# Patient Record
Sex: Female | Born: 1950 | Race: White | Hispanic: No | Marital: Married
Health system: Midwestern US, Academic
[De-identification: ages and names within clinical notes are randomized; demographics above are authoritative.]

---

## 2021-03-31 ENCOUNTER — Encounter: Admit: 2021-03-31 | Discharge: 2021-03-31 | Payer: MEDICARE

## 2021-03-31 ENCOUNTER — Ambulatory Visit: Admit: 2021-03-31 | Discharge: 2021-03-31 | Payer: MEDICARE

## 2021-03-31 DIAGNOSIS — I10 Essential (primary) hypertension: Secondary | ICD-10-CM

## 2021-03-31 DIAGNOSIS — K146 Glossodynia: Secondary | ICD-10-CM

## 2021-03-31 DIAGNOSIS — E7800 Pure hypercholesterolemia, unspecified: Secondary | ICD-10-CM

## 2021-03-31 DIAGNOSIS — E119 Type 2 diabetes mellitus without complications: Secondary | ICD-10-CM

## 2021-03-31 NOTE — Progress Notes
Date of Service: 03/31/2021    Subjective:             Nancy Small is a 70 y.o. female.    History of Present Illness  New patient referred by Dr. Yetta Barre. She presents with her husband for evaluation of mouth sores. States that a few years ago she shoved a qtip in her left ear and has noticed mouth sores throughout her mouth for 2 years. They come and go and usually goes away with steroids or antibiotics. These sores are painful and her mouth and tongue gets red and shes has burning snesation throughout her mouth.  She cannot identify a particular trigger. The sores do not bleed. She does have strong acid reflux and takes a PPI. A punch biopsy was performed by Dr. Jon Gills and was told it was not infectious and no cancer. She has some immunodeficency. She has had cervical spine surgeries but no other head/neck surgeris.  She has a 40 pack years smoking history but quit 12 years ago. No chewing tobacco. She feels like food gets stuck at times but denies aspiration.  She denies dyspnea, voice changes, otalgia, globus sensation, unintentional weight loss, oral fullness, fevers, chills, night sweats, neck lumps/bumps, or throat pain. She uses magic mouth wash which does relive the symptoms and sore but they reappear. + dry mouth.  No dry eyes or known autoimmune disease.    When asked about poor sleep, life stress and anxiety, she and her husband report that she has all of the above, severe.    Medical History:   Diagnosis Date   ? DM (diabetes mellitus) (HCC)    ? High cholesterol    ? Hypertension      Surgical History:   Procedure Laterality Date   ? APPENDECTOMY     ? BACK SURGERY      lower   ? GALLBLADDER SURGERY     ? HYSTERECTOMY     ? NECK SURGERY     ? SHOULDER REPLACEMENT       History reviewed. No pertinent family history.  Social History     Tobacco Use   Smoking Status Former Smoker   Smokeless Tobacco Never Used     Social History     Substance and Sexual Activity   Drug Use Not on file PMH, SH, FH, allergies and medications above have been reviewed.         Review of Systems   Constitutional: Negative.    HENT: Positive for mouth sores.    Eyes: Negative.    Respiratory: Negative.    Cardiovascular: Negative.    Gastrointestinal: Negative.    Endocrine: Negative.    Genitourinary: Negative.    Musculoskeletal: Negative.    Skin: Negative.    Allergic/Immunologic: Negative.    Neurological: Positive for headaches.   Hematological: Negative.    Psychiatric/Behavioral: Negative.          Objective:         ? albuterol-ipratropium (DUONEB) 0.5 mg-3 mg(2.5 mg base)/3 mL nebulizer solution ipratropium 0.5 mg-albuterol 3 mg (2.5 mg base)/3 mL nebulization soln   ? allopurinoL (ZYLOPRIM) 300 mg tablet allopurinol 300 mg tablet   ? bumetanide (BUMEX) 2 mg tablet    ? carisoprodoL (SOMA) 350 mg tablet Take 350 mg by mouth three times daily.   ? diclofenac sodium (VOLTAREN) 1 % topical gel    ? diphenhydrAMINE/lidocaine/antacid (#) (MAGIC MOUTHWASH) 1:1:1 oral suspension Swish and Swallow  by mouth as directed  three times daily as needed for Mouth Pain.   ? duloxetine DR (CYMBALTA) 60 mg capsule duloxetine 60 mg capsule,delayed release   ? estradioL (ESTRACE) 0.5 mg tablet    ? famciclovir (FAMVIR) 250 mg tablet TAKE SIX TABLETS BY MOUTH DAILY FOR ONE DAY   ? famotidine (PEPCID) 20 mg tablet Take 20 mg by mouth twice daily.   ? FEROSUL 325 mg (65 mg iron) tablet Take 325 mg by mouth three times daily.   ? gabapentin (NEURONTIN) 300 mg capsule gabapentin 300 mg capsule   ? hydrOXYchloroQUINE (PLAQUENIL) 200 mg tablet hydroxychloroquine 200 mg tablet   ? hydrOXYzine HCL (ATARAX) 25 mg tablet Take 25 mg by mouth.   ? levothyroxine (SYNTHROID) 75 mcg tablet    ? lidocaine hcl viscous 2 % solution TAKE ONE ML BY MOUTH EVERY THREE HOURS AS NEEDED FOR MOUTH AND THROAT FOR SEVEN DAYS   ? lisinopriL (ZESTRIL) 40 mg tablet 40 mg.   ? Omega-3 Acid Ethyl Esters (LOVAZA) 1 gram capsule    ? omeprazole DR (PRILOSEC) 40 mg capsule    ? oxyCODONE (ROXICODONE) 30 mg tablet TAKE 1 AND 1/2 TABLETS AS NEEDED BY MOUTH EVERY 6 HOURS FOR 28 DAYS   ? pilocarpine HCL (SALAGEN) 5 mg tablet Take 5 mg by mouth three times daily.   ? promethazine (PHENERGAN) 25 mg tablet Take 25 mg by mouth every 4 hours as needed.   ? rizatriptan (MAXALT-MLT) 10 mg rapid dissolve tablet rizatriptan 10 mg disintegrating tablet   ? rOPINIRole (REQUIP) 0.25 mg tablet ropinirole 0.25 mg tablet   ? rosuvastatin (CRESTOR) 10 mg tablet    ? spironolactone (ALDACTONE) 25 mg tablet    ? tamsulosin (FLOMAX) 0.4 mg capsule tamsulosin 0.4 mg capsule   ? tiotropium-olodateroL (STIOLTO RESPIMAT) 2.5-2.5 mcg/actuation inhaler Stiolto Respimat 2.5 mcg-2.5 mcg/actuation solution for inhalation     Vitals:    03/31/21 1034   BP: 132/74   Pulse: 76   PainSc: Three   Weight: 95.3 kg (210 lb)   Height: 160 cm (5' 3)     Body mass index is 37.2 kg/m?Marland Kitchen     Physical Exam  General: Well-developed, well-nourished   Communication and Voice: Clear pitch, normal tone   Hearing: Hearing adequate for verbal communication bilaterally   Inspection: Normocephalic and atraumatic without mass or lesion   Palpation: Facial skeleton intact without bony stepoffs   Facial Strength: Facial motility symmetric and full bilaterally   Pinna: External ear intact and fully developed   External Canal: Canal is patent with intact skin   Tympanic Membrane: TM clear AU   External Nose: No scar or anatomic deformity   Internal Nose: Septum intact. No edema, polyp, or rhinorrhea.   Oral cavity, Lips, Teeth, and Gums: Mild mucosal irritation on the right lateral tongue and right posterior floor of mouth, tender to palpation,  but no discreete masses or ulcers.   Oropharynx: No erythema or exudate, no masses or ulcerations, non-obstructive tonsils  Larynx: Normal voice, no stridor or stertor.   Neck, Trachea, Lymphatics: Midline trachea without mass or lesion, no lymphadenopathy   Thyroid: No mass or nodularity Eyes: No nystagmus with equal extraocular motion bilaterally   Neuro/Psych/Balance: Patient oriented and appropriate in interaction; Appropriate mood and affect; Cranial nerves II-XII are intact   Respiratory effort: Equal inspiration and expiration, no respiratory distress   Peripheral Vascular: Warm extremities    Differential Diagnosis:  Tumor, aphthous stomatitis, lichen planus, pemphigus, neurologic, burning mouth/anxiety, dental, vitamin  deficiency, and diet related       Assessment and Plan:  1. 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     The only class of medications (other than moisturizing agents like pilocarpine or evoxac) that has shown benefit in BMS is benzodiazepine therapy and/or PAXIL (for depression and anxiety).  The patient will work on stress reduction and with her PCP/psychiatrist to find the right balance and follow up on an as needed basis.      Avoid excessive abx and look for thrush/consider diflucan for new or refractory symptoms.    F/u prn if ulcer or sore persists greater than 7-10 days for biopsy.    Pilocarpine (or Evoxac) and Biotine line of oral products recommended.    Maheer Minus Breeding, MD  Otolaryngology Resident    ENT STAFF:    I personally performed or supervised the key portions of the E/M visit, discussed case with the resident and concur with documentation of history, physical exam, assessment, and treatment plan unless otherwise noted.  I personally performed or directly visualized any procedure performed.  Changes or additions are noted in bold.    Johnny Bridge, MD

## 2021-12-20 IMAGING — MR MRI ANKLE RT WO CONTRAST
6 of 7 series · 37 of 40 positions shown · non-contrast
Comparison: None available.

INDICATION: Sprain of ligament of right ankle, initial encounter
TECHNIQUE: Multiplanar, multisequence imaging of the right ankle/hindfoot was performed without  contrast.

[Series 4: t1_sag · sagittal · right · 3.0mm · 0.42mm/px · 5 of 24 slices shown]
[im 1/24]
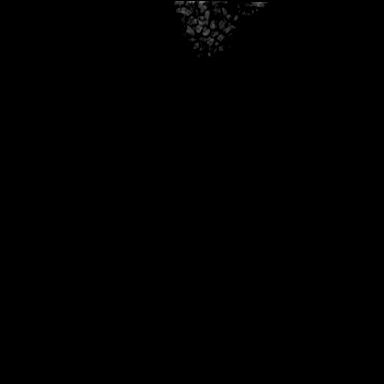
[im 6/24]
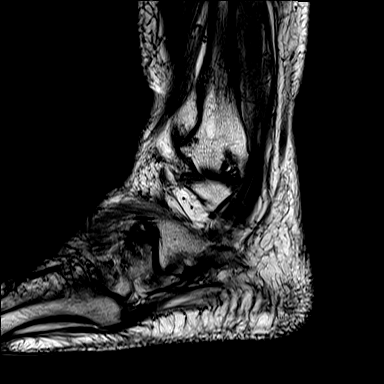
[im 12/24]
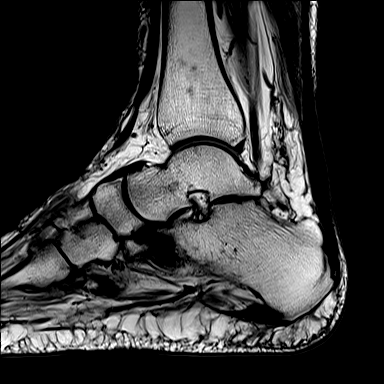
[im 18/24]
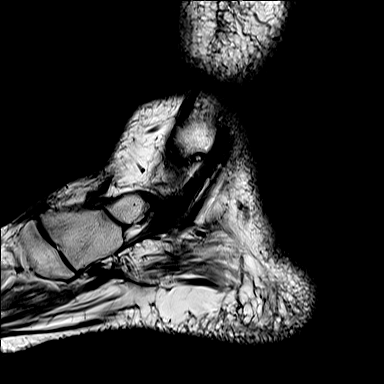
[im 24/24]
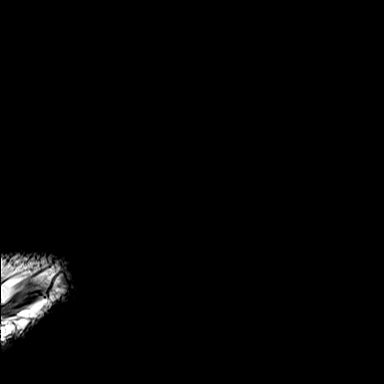

[Series 5: t1_axial · axial · right · 3.0mm · 0.42mm/px · z∈[-86,+21]mm · 6 of 28 slices shown]
[im 1/28]
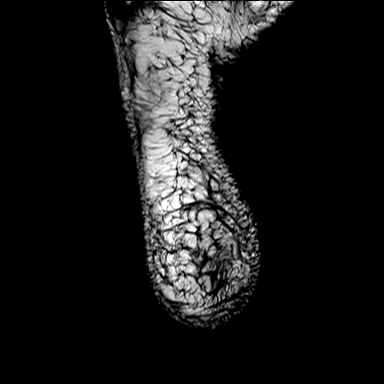
[im 6/28]
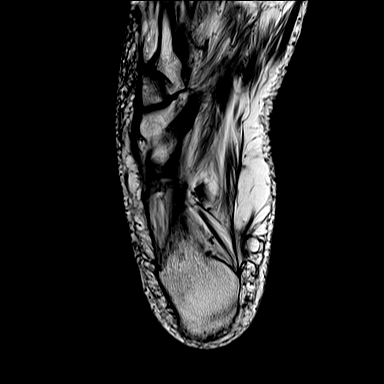
[im 11/28]
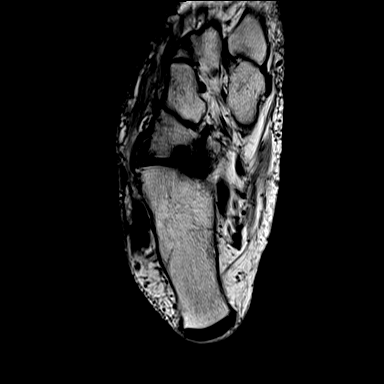
[im 17/28]
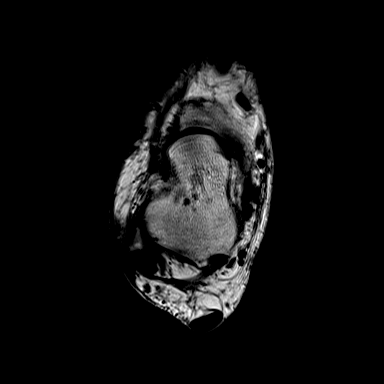
[im 22/28]
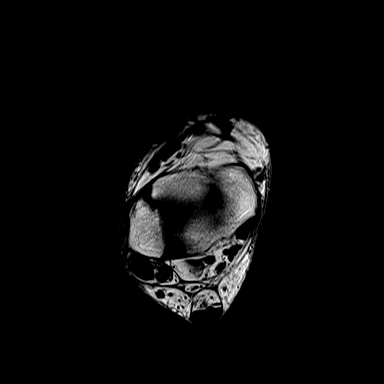
[im 28/28]
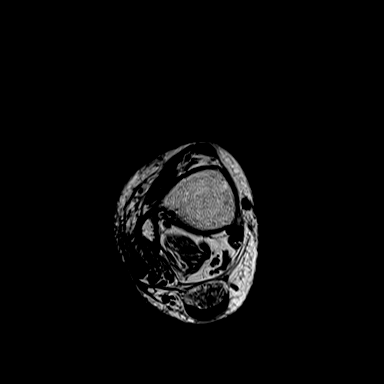

[Series 7: t2_cor_fs · coronal · right · 3.5mm · 0.50mm/px · 7 of 32 slices shown]
[im 1/32]
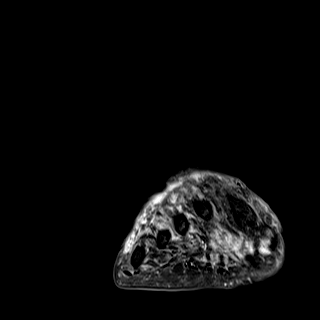
[im 6/32]
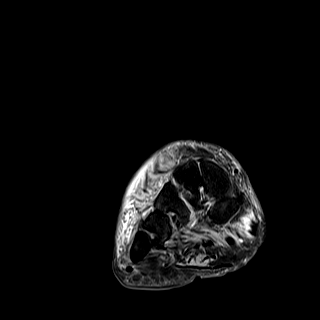
[im 11/32]
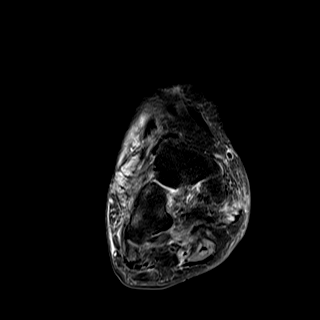
[im 16/32]
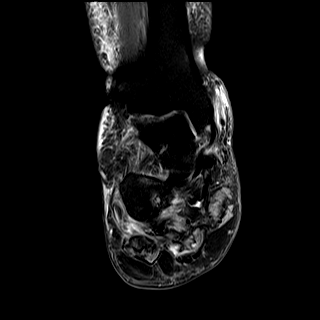
[im 21/32]
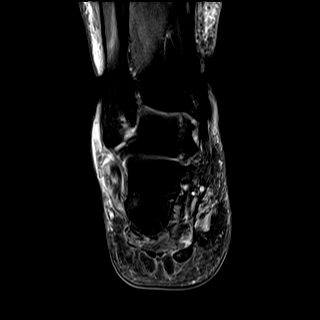
[im 26/32]
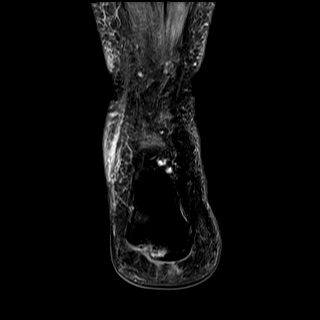
[im 32/32]
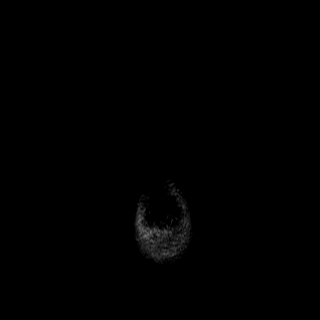

[Series 11: t2_sag_fs · sagittal · right · 3.0mm · 0.62mm/px · 5 of 24 slices shown]
[im 1/24]
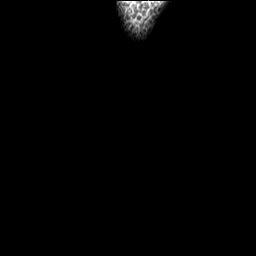
[im 6/24]
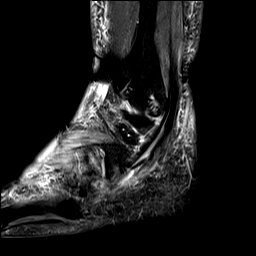
[im 12/24]
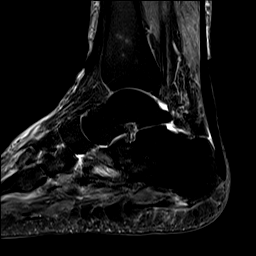
[im 18/24]
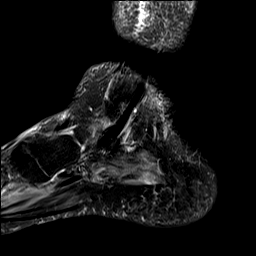
[im 24/24]
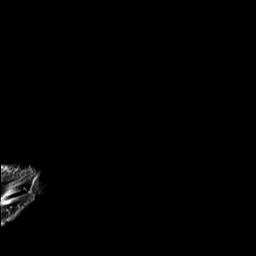

[Series 13: t2_axial_fs · axial · right · 3.0mm · 0.62mm/px · z∈[-86,+21]mm · 6 of 28 slices shown]
[im 1/28]
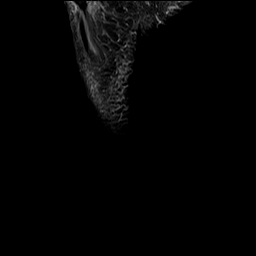
[im 6/28]
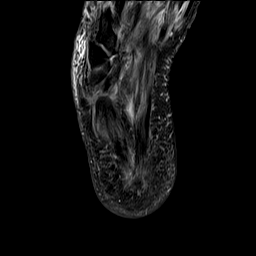
[im 11/28]
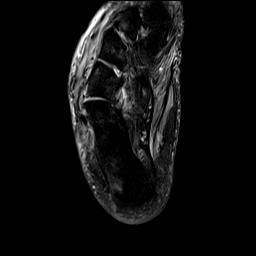
[im 17/28]
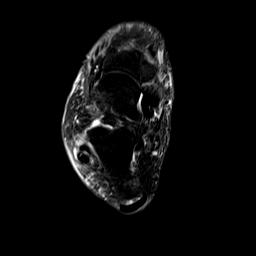
[im 22/28]
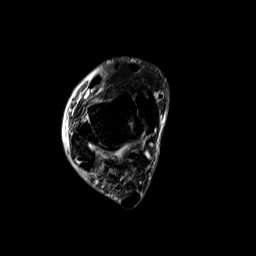
[im 28/28]
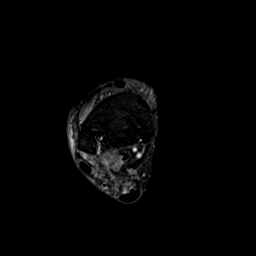

[Series 100: pd_axial_fs_obli · axial · right · 3.0mm · 0.50mm/px · z∈[-30,+23]mm · 8 of 34 slices shown]
[im 1/34]
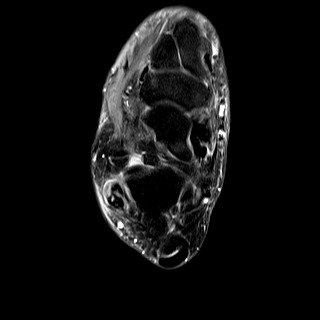
[im 5/34]
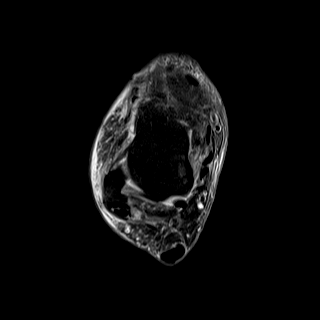
[im 10/34]
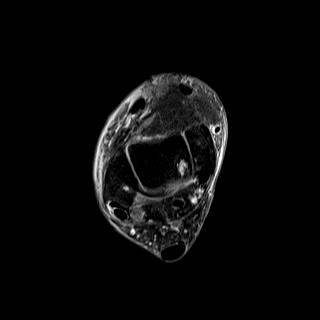
[im 15/34]
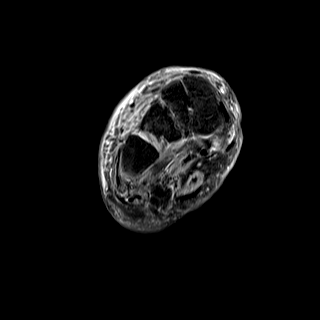
[im 19/34]
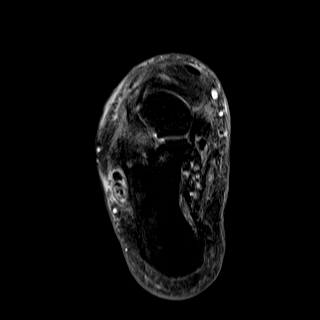
[im 24/34]
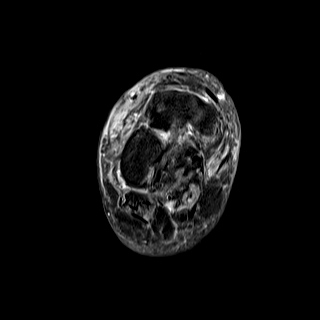
[im 29/34]
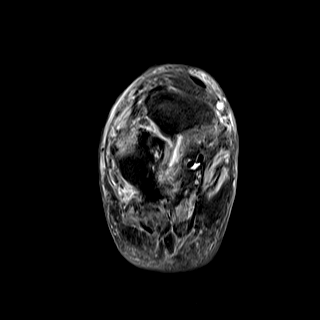
[im 34/34]
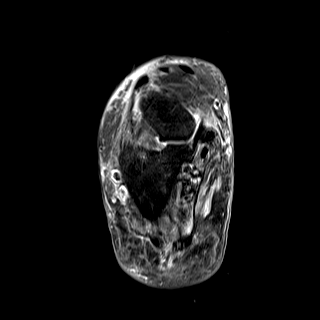

[37 of 40 positions shown; findings below may reference images not displayed]

FINDINGS: OSSEOUS: No acute fracture, avascular necrosis or aggressive osseous lesion. Moderate calcaneocuboid joint osteoarthritis, with reactive subchondral marrow edema and cystic change within the cuboid and the calcaneus. Moderate naviculocuneiform joint osteoarthritis.

TIBIOTALAR JOINT:

Articular cartilage: 4 x 8 mm osteochondral defect along the medial shoulder of the talus, with associated underlying subchondral cystic change and marrow edema.

Tibiotalar joint effusion and intra-articular bodies: None.

LIGAMENTS:

Anterior talofibular ligament: Intact.

Calcaneofibular ligament: Intact.

Posterior talofibular ligament: Intact.

Anterior and posterior syndesmotic ligaments: Intact.

Deep fibers the deltoid ligament: Intact.

Spring ligament: Intact.

TENDONS:

Peroneus brevis tendon: Intact.

Peroneus longus tendon: High-grade partial-thickness tear in the inframalleolar region, with mild to moderate associated tenosynovitis.

Tibialis posterior tendon: Intact.

Flexor digitorum longus: Intact.

Flexor hallucis longus: Intact.

Anterior ankle tendons: Intact.

Achilles tendon: Mild tendinosis. No tear.

PLANTAR APONEUROSIS: Intermediate signal thickening of the medial and lateral bundles at the calcaneal origin. High-grade partial-thickness tear at the origin of the lateral bundle at the calcaneal origin is present. A few attenuated plantar fibers likely remain intact. Overlying soft tissues swelling is present.

OTHER:

Sinus tarsi: No scar or edema.

Tarsal tunnel: Intact.

Acute muscle injury: No acute muscle injury. Diffuse T2 signal hyperintensity within the intrinsic foot musculature.

Focal fatty infiltration of the muscles: Diffuse moderate fatty atrophy at the intrinsic foot musculature.
IMPRESSION: 1.
High-grade partial-thickness tear of the lateral bundle of the plantar aponeurosis, at the calcaneal origin.

2.
High-grade partial-thickness tear of the peroneus longus tendon, in the inframalleolar region.

3.
4 x 8 mm osteochondral defect along the medial shoulder of the talus.

4.
Moderate calcaneocuboid joint osteoarthritis.

## 2021-12-23 IMAGING — MR MRI FOREFOOT RT WO CONTRAST
7 series · 40 of 40 positions shown · non-contrast
Comparison: None available.

INDICATION: Open wound, right foot, initial encounter.
TECHNIQUE: Multiplanar, multisequence imaging of the right forefoot was performed without contrast.

[Series 5: t1_sag · sagittal · right · 3.0mm · 0.50mm/px · 6 of 32 slices shown (1 of 2)]
[im 1/32]
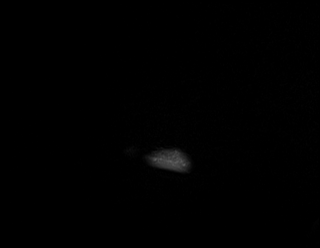
[im 7/32]
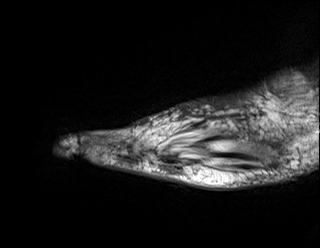
[im 13/32]
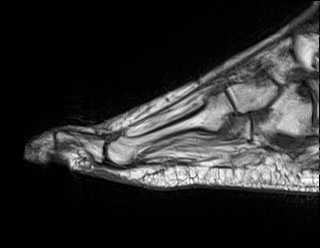
[im 19/32]
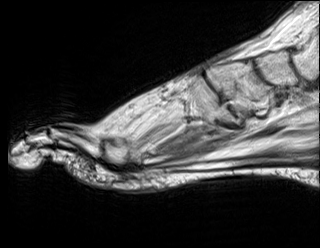
[im 25/32]
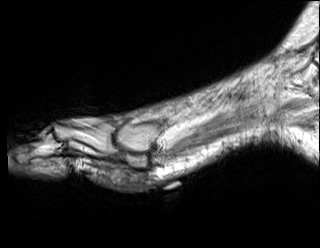
[im 32/32]
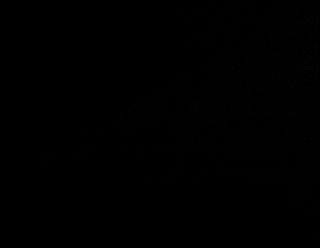

[Series 6: t1_sag · sagittal · right · 3.0mm · 0.50mm/px · 6 of 32 slices shown (2 of 2)]
[im 1/32]
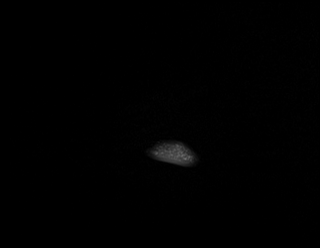
[im 7/32]
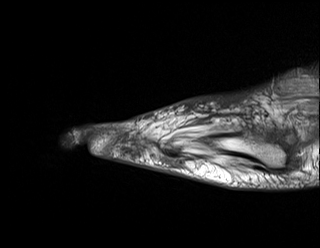
[im 13/32]
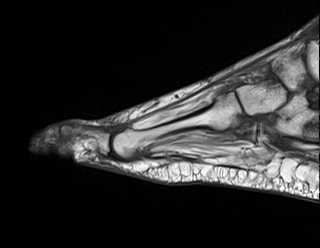
[im 19/32]
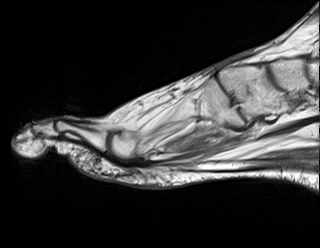
[im 25/32]
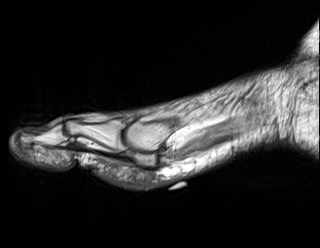
[im 32/32]
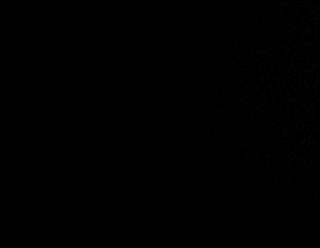

[Series 7: ir_sag · sagittal · right · 3.0mm · 0.56mm/px · 6 of 32 slices shown]
[im 1/32]
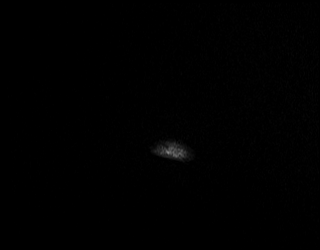
[im 7/32]
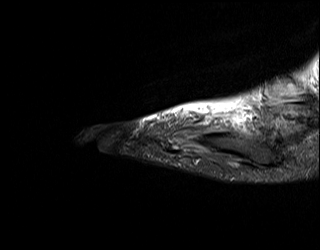
[im 13/32]
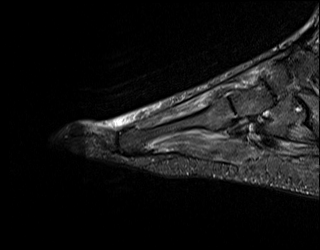
[im 19/32]
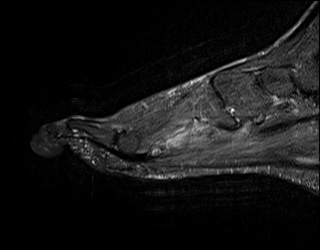
[im 25/32]
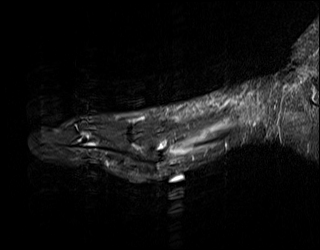
[im 32/32]
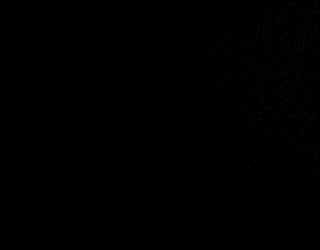

[Series 8: t1_axial · axial · right · 3.0mm · 0.36mm/px · z∈[-111,-56]mm · 4 of 20 slices shown]
[im 1/20]
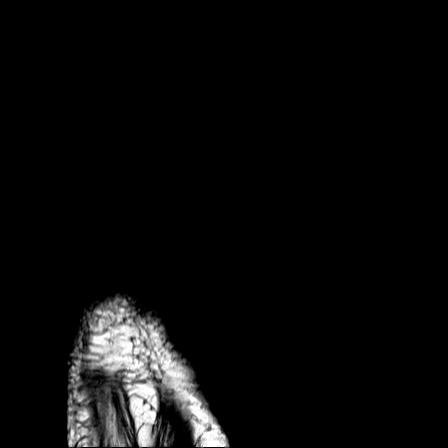
[im 7/20]
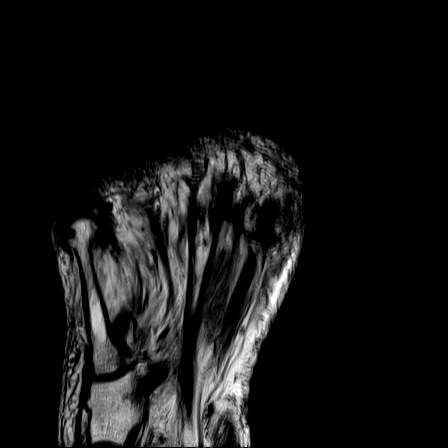
[im 13/20]
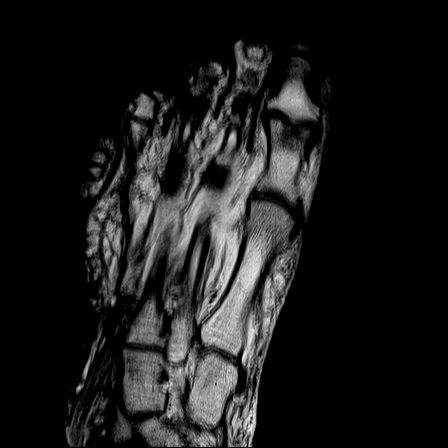
[im 20/20]
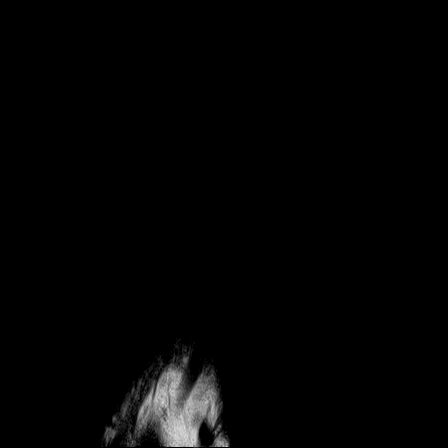

[Series 9: ir_axial · axial · right · 3.0mm · 0.47mm/px · z∈[-108,-52]mm · 4 of 20 slices shown]
[im 1/20]
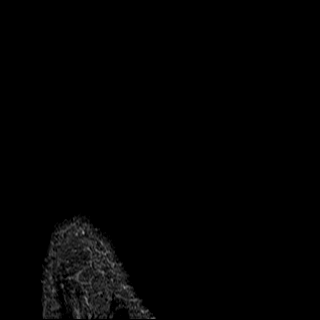
[im 7/20]
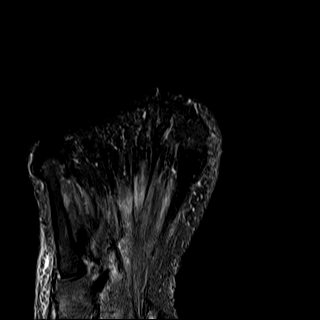
[im 13/20]
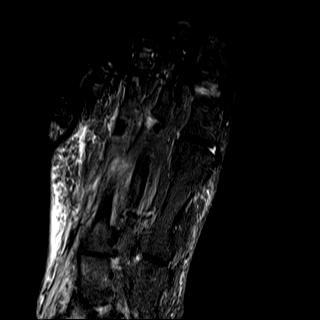
[im 20/20]
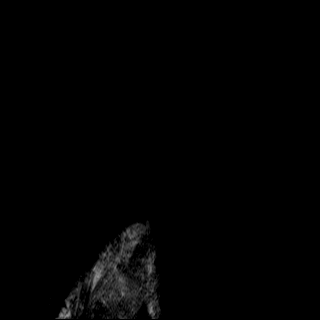

[Series 10: t1_cor · coronal · right · 3.0mm · 0.44mm/px · 7 of 40 slices shown]
[im 1/40]
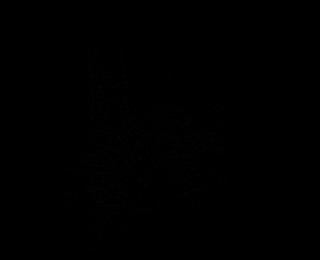
[im 7/40]
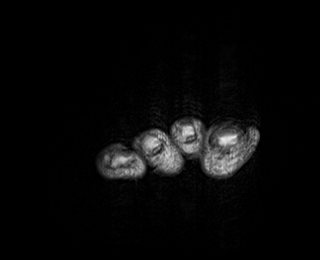
[im 14/40]
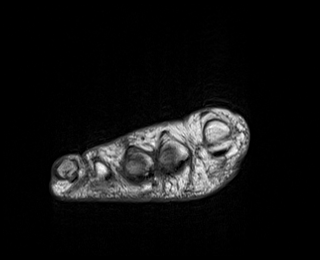
[im 20/40]
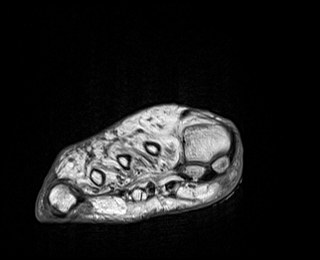
[im 27/40]
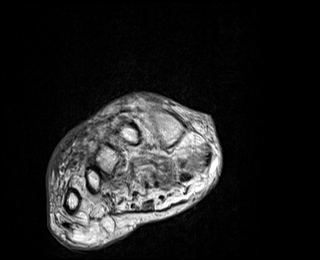
[im 33/40]
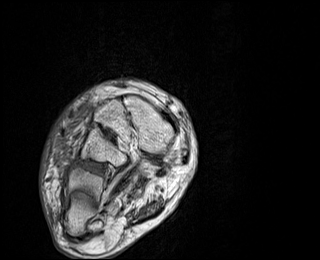
[im 40/40]
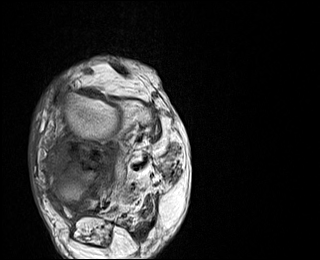

[Series 11: ir_cor · coronal · right · 3.0mm · 0.44mm/px · 7 of 40 slices shown]
[im 1/40]
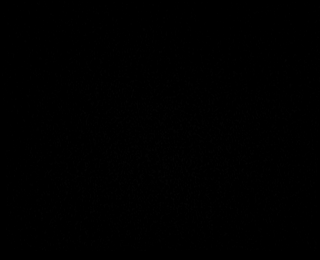
[im 7/40]
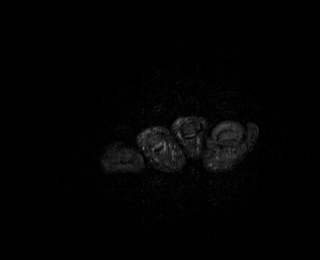
[im 14/40]
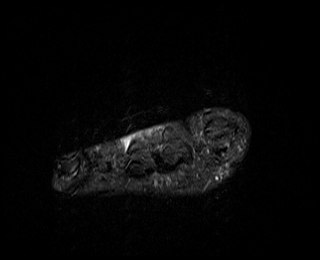
[im 20/40]
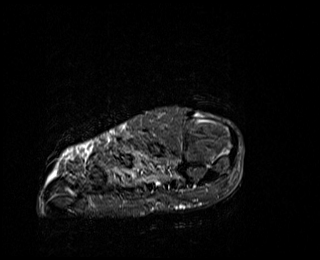
[im 27/40]
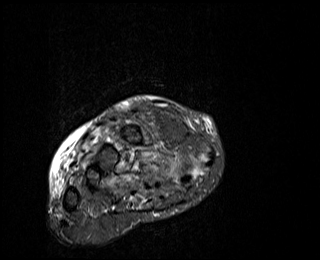
[im 33/40]
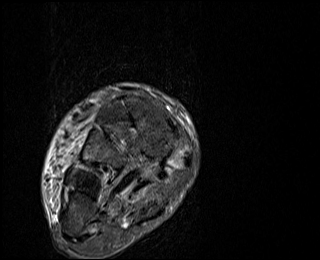
[im 40/40]
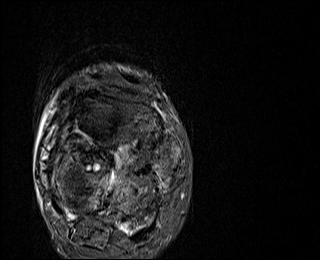

[40 of 40 positions shown; findings below may reference images not displayed]

FINDINGS: OSSEOUS: No acute fracture, avascular necrosis or aggressive osseous lesion. Partial visualization of mild edema within the cuboid. Partial visualization of moderate calcaneocuboid joint osteoarthritis, recently characterized on MRI of the right ankle. Mild scattered osteoarthritis of the MTP and IP joints.

LISFRANC LIGAMENT: Intact.

PLANTAR PLATE, MTP AND IP COLLATERAL LIGAMENTS:

Plantar plate: Intact.

Collateral ligaments: Intact.

MUSCULOTENDINOUS: 

Flexor tendons: The visualized flexor tendons are intact. No tenosynovitis.

Extensor tendons: The visualized extensor tendons are intact. No tenosynovitis.

Intramuscular edema: Mild diffuse increased T2 signal within the intrinsic foot musculature.

Fatty atrophy: Diffuse fatty atrophy of the intrinsic foot musculature.

OTHER:

Perineural Fibrosis: None.

Intermetatarsal bursal fluid:No significant fluid within the intermetatarsal bursa.

Additional soft tissues: Nonspecific subcutaneous soft tissue edema is present overlying the dorsolateral aspect of the forefoot. A marker is placed overlying the plantar medial aspect of the foot near the level of the first MTP joint. No suspicious mass or fluid collection is identified to correspond to the patient-directed area of concern. There is mild low T1 and T2 signal within the underlying soft tissues, which could represent fibrosis/pressure lesion.
IMPRESSION: 1.
No acute fracture or stress reaction of the forefoot.

2.
No suspicious mass or fluid collection to correspond to the patient-directed area of concern. Mild low T1 and T2 signal within the underlying soft tissues, which may represent fibrosis/pressure lesion.
